# Patient Record
Sex: Female | Born: 1980 | Race: Black or African American | Hispanic: No | Marital: Single | Smoking: Never smoker
Health system: Southern US, Community
[De-identification: ages and names within clinical notes are randomized; demographics above are authoritative.]

## PROBLEM LIST (undated history)

## (undated) DIAGNOSIS — G43909 Migraine, unspecified, not intractable, without status migrainosus: Secondary | ICD-10-CM

## (undated) HISTORY — PX: HAND SURGERY: SHX662

---

## 2005-12-21 ENCOUNTER — Emergency Department: Payer: Self-pay | Admitting: Emergency Medicine

## 2006-02-18 ENCOUNTER — Other Ambulatory Visit: Payer: Self-pay

## 2006-02-18 ENCOUNTER — Emergency Department: Payer: Self-pay | Admitting: Emergency Medicine

## 2007-01-01 ENCOUNTER — Emergency Department: Payer: Self-pay | Admitting: Internal Medicine

## 2008-01-17 ENCOUNTER — Emergency Department: Payer: Self-pay | Admitting: Emergency Medicine

## 2008-09-08 ENCOUNTER — Ambulatory Visit: Payer: Self-pay | Admitting: Internal Medicine

## 2011-04-16 DIAGNOSIS — Z309 Encounter for contraceptive management, unspecified: Secondary | ICD-10-CM | POA: Insufficient documentation

## 2013-04-02 DIAGNOSIS — H9312 Tinnitus, left ear: Secondary | ICD-10-CM | POA: Insufficient documentation

## 2013-04-30 ENCOUNTER — Ambulatory Visit: Payer: Self-pay | Admitting: Unknown Physician Specialty

## 2014-11-14 ENCOUNTER — Emergency Department
Admission: EM | Admit: 2014-11-14 | Discharge: 2014-11-14 | Disposition: A | Discharge status: Home / Self Care | Payer: No Typology Code available for payment source | Attending: Emergency Medicine | Admitting: Emergency Medicine

## 2014-11-14 DIAGNOSIS — Y998 Other external cause status: Secondary | ICD-10-CM | POA: Diagnosis not present

## 2014-11-14 DIAGNOSIS — Y92410 Unspecified street and highway as the place of occurrence of the external cause: Secondary | ICD-10-CM | POA: Diagnosis not present

## 2014-11-14 DIAGNOSIS — Y9389 Activity, other specified: Secondary | ICD-10-CM | POA: Insufficient documentation

## 2014-11-14 DIAGNOSIS — S3992XA Unspecified injury of lower back, initial encounter: Secondary | ICD-10-CM | POA: Insufficient documentation

## 2014-11-14 MED ORDER — IBUPROFEN 800 MG PO TABS: 800.0000 mg | ORAL_TABLET | Freq: Three times a day (TID) | ORAL | Status: DC | PRN

## 2014-11-14 MED ORDER — TRAMADOL HCL 50 MG PO TABS: 50.0000 mg | ORAL_TABLET | Freq: Four times a day (QID) | ORAL | Status: DC | PRN

## 2014-11-14 MED ORDER — IBUPROFEN 800 MG PO TABS
ORAL_TABLET | ORAL | Status: AC
  Administered 2014-11-14: 800 mg via ORAL
  Filled 2014-11-14: qty 1

## 2014-11-14 MED ORDER — IBUPROFEN 800 MG PO TABS
800.0000 mg | ORAL_TABLET | Freq: Once | ORAL | Status: AC
  Administered 2014-11-14: 800 mg via ORAL

## 2014-11-14 MED ORDER — METHOCARBAMOL 500 MG PO TABS
ORAL_TABLET | ORAL | Status: AC
  Administered 2014-11-14: 1000 mg via ORAL
  Filled 2014-11-14: qty 2

## 2014-11-14 MED ORDER — METHOCARBAMOL 500 MG PO TABS
1000.0000 mg | ORAL_TABLET | Freq: Once | ORAL | Status: AC
  Administered 2014-11-14: 1000 mg via ORAL

## 2014-11-14 MED ORDER — METHOCARBAMOL 750 MG PO TABS: 1500.0000 mg | ORAL_TABLET | Freq: Four times a day (QID) | ORAL | Status: DC
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
## 2014-11-14 NOTE — Discharge Instructions (Signed)
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
## 2014-12-11 ENCOUNTER — Ambulatory Visit
Admission: RE | Admit: 2014-12-11 | Discharge: 2014-12-11 | Disposition: A | Discharge status: Home / Self Care | Payer: No Typology Code available for payment source | Source: Ambulatory Visit | Attending: Chiropractor | Admitting: Chiropractor

## 2014-12-11 ENCOUNTER — Other Ambulatory Visit: Payer: Self-pay | Admitting: Chiropractor

## 2014-12-11 DIAGNOSIS — M546 Pain in thoracic spine: Secondary | ICD-10-CM | POA: Diagnosis present

## 2014-12-11 DIAGNOSIS — R262 Difficulty in walking, not elsewhere classified: Secondary | ICD-10-CM | POA: Diagnosis present

## 2014-12-11 DIAGNOSIS — M545 Low back pain: Secondary | ICD-10-CM | POA: Diagnosis present
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
## 2015-12-30 ENCOUNTER — Encounter: Payer: Self-pay | Admitting: Emergency Medicine

## 2015-12-30 ENCOUNTER — Emergency Department
Admission: EM | Admit: 2015-12-30 | Discharge: 2015-12-30 | Disposition: A | Discharge status: Home / Self Care | Payer: BLUE CROSS/BLUE SHIELD | Attending: Emergency Medicine | Admitting: Emergency Medicine

## 2015-12-30 DIAGNOSIS — Z5321 Procedure and treatment not carried out due to patient leaving prior to being seen by health care provider: Secondary | ICD-10-CM | POA: Insufficient documentation

## 2015-12-30 DIAGNOSIS — R109 Unspecified abdominal pain: Secondary | ICD-10-CM | POA: Diagnosis not present

## 2015-12-30 LAB — COMPREHENSIVE METABOLIC PANEL
ALBUMIN: 4.1 g/dL (ref 3.5–5.0)
ALT: 11 U/L — ABNORMAL LOW (ref 14–54)
AST: 16 U/L (ref 15–41)
Alkaline Phosphatase: 41 U/L (ref 38–126)
Anion gap: 9 (ref 5–15)
BUN: 14 mg/dL (ref 6–20)
CO2: 23 mmol/L (ref 22–32)
CREATININE: 0.94 mg/dL (ref 0.44–1.00)
Calcium: 9 mg/dL (ref 8.9–10.3)
Chloride: 105 mmol/L (ref 101–111)
GFR calc Af Amer: >60 mL/min (ref >60)
GFR calc non Af Amer: >60 mL/min (ref >60)
GLUCOSE: 103 mg/dL — AB (ref 65–99)
Potassium: 3.7 mmol/L (ref 3.5–5.1)
SODIUM: 137 mmol/L (ref 135–145)
TOTAL PROTEIN: 7.1 g/dL (ref 6.5–8.1)
Total Bilirubin: 0.7 mg/dL (ref 0.3–1.2)

## 2015-12-30 LAB — URINALYSIS COMPLETE WITH MICROSCOPIC (ARMC ONLY)
BILIRUBIN URINE: NEGATIVE
Bacteria, UA: NONE SEEN
Glucose, UA: NEGATIVE mg/dL
HGB URINE DIPSTICK: NEGATIVE
LEUKOCYTES UA: NEGATIVE
NITRITE: NEGATIVE
PH: 7 (ref 5.0–8.0)
Protein, ur: 30 mg/dL — AB
SPECIFIC GRAVITY, URINE: 1.024 (ref 1.005–1.030)

## 2015-12-30 LAB — CBC
HEMATOCRIT: 37 % (ref 35.0–47.0)
Hemoglobin: 13 g/dL (ref 12.0–16.0)
MCH: 31.3 pg (ref 26.0–34.0)
MCHC: 35 g/dL (ref 32.0–36.0)
MCV: 89.4 fL (ref 80.0–100.0)
Platelets: 257 10*3/uL (ref 150–440)
RBC: 4.14 MIL/uL (ref 3.80–5.20)
RDW: 13.6 % (ref 11.5–14.5)
WBC: 7.4 10*3/uL (ref 3.6–11.0)

## 2015-12-30 LAB — LIPASE, BLOOD: Lipase: 21 U/L (ref 11–51)

## 2015-12-30 LAB — POCT PREGNANCY, URINE: Preg Test, Ur: NEGATIVE

## 2015-12-30 NOTE — ED Triage Notes (Signed)
Patient ambulatory to triage with steady gait, without difficulty or distress noted; pt reports mid abd pain since Friday; N/V tonight

## 2015-12-31 ENCOUNTER — Emergency Department
Admission: EM | Admit: 2015-12-31 | Discharge: 2015-12-31 | Disposition: A | Discharge status: Home / Self Care | Payer: BLUE CROSS/BLUE SHIELD | Attending: Student in an Organized Health Care Education/Training Program | Admitting: Student in an Organized Health Care Education/Training Program

## 2015-12-31 ENCOUNTER — Encounter: Payer: Self-pay | Admitting: Emergency Medicine

## 2015-12-31 DIAGNOSIS — R112 Nausea with vomiting, unspecified: Secondary | ICD-10-CM | POA: Diagnosis not present

## 2015-12-31 DIAGNOSIS — Z79899 Other long term (current) drug therapy: Secondary | ICD-10-CM | POA: Diagnosis not present

## 2015-12-31 DIAGNOSIS — R197 Diarrhea, unspecified: Secondary | ICD-10-CM | POA: Diagnosis present

## 2015-12-31 MED ORDER — PROMETHAZINE HCL 12.5 MG PO TABS: 12.5000 mg | ORAL_TABLET | Freq: Four times a day (QID) | ORAL | 0 refills | Status: AC | PRN

## 2015-12-31 MED ORDER — METOCLOPRAMIDE HCL 5 MG/ML IJ SOLN
10.0000 mg | Freq: Once | INTRAMUSCULAR | Status: AC
  Administered 2015-12-31: 10 mg via INTRAVENOUS
  Filled 2015-12-31: qty 2

## 2015-12-31 MED ORDER — SODIUM CHLORIDE 0.9 % IV BOLUS (SEPSIS)
1000.0000 mL | Freq: Once | INTRAVENOUS | Status: AC
  Administered 2015-12-31: 1000 mL via INTRAVENOUS

## 2015-12-31 MED ORDER — PROMETHAZINE HCL 25 MG/ML IJ SOLN: 25.0000 mg | Freq: Once | INTRAMUSCULAR | Status: DC

## 2015-12-31 NOTE — ED Provider Notes (Signed)
[facility] Emergency Department Provider Note    None    (approximate)  I have reviewed the triage vital signs and the nursing notes.   HISTORY  Chief Complaint Abdominal Pain    HPI Susan Saunders is a 35 y.o. female who presents with 3 days of midepigastric pain associated with nausea vomiting and a few episodes of nonbloody diarrhea. Patient was in the ER yesterday had lab work was drawn which was normal but she left before she is able to be seen. She denies any fever or shortness of breath or cough. Denies any blood in her vomit. She never had this symptom before. States that the nausea is worsened by eating. No History of previous abdominal surgeries.LMP was last week.  Prevacid test performed yesterday was negative.  She denies any pelvic pain, vaginal bleeding, vaginal discharge, dysuria or flank pain.   History reviewed. No pertinent past medical history.  There are no active problems to display for this patient.   History reviewed. No pertinent surgical history.  Prior to Admission medications   Medication Sig Start Date End Date Taking? Authorizing Provider  ibuprofen (ADVIL,MOTRIN) 800 MG tablet Take 1 tablet (800 mg total) by mouth every 8 (eight) hours as needed for moderate pain. 11/14/14   Joni Reining, PA-C  methocarbamol (ROBAXIN-750) 750 MG tablet Take 2 tablets (1,500 mg total) by mouth 4 (four) times daily. 11/14/14   Joni Reining, PA-C  promethazine (PHENERGAN) 12.5 MG tablet Take 1 tablet (12.5 mg total) by mouth every 6 (six) hours as needed for nausea or vomiting. 12/31/15   Willy Eddy, MD  traMADol (ULTRAM) 50 MG tablet Take 1 tablet (50 mg total) by mouth every 6 (six) hours as needed for moderate pain. 11/14/14   Joni Reining, PA-C    Allergies Review of patient's allergies indicates no known allergies.  History reviewed. No pertinent family history.  Social History Social History  Substance Use Topics  . Smoking  status: Never Smoker  . Smokeless tobacco: Never Used  . Alcohol use No    Review of Systems Patient denies headaches, rhinorrhea, blurry vision, numbness, shortness of breath, chest pain, edema, cough, abdominal pain, nausea, vomiting, diarrhea, dysuria, fevers, rashes or hallucinations unless otherwise stated above in HPI. ____________________________________________   PHYSICAL EXAM:  VITAL SIGNS: ED Triage Vitals [12/31/15 0917]  Enc Vitals Group     BP 107/81     Pulse Rate 72     Resp 18     Temp 98.7 F (37.1 C)     Temp Source Oral     SpO2 100 %     Weight 130 lb (59 kg)     Height  (1.651 m)     Head Circumference      Peak Flow      Pain Score      Pain Loc      Pain Edu?      Excl. in GC?     Constitutional: Alert and oriented. Well appearing and in no acute distress. Eyes: Conjunctivae are normal. PERRL. EOMI. Head: Atraumatic. Nose: No congestion/rhinnorhea. Mouth/Throat: Mucous membranes are moist.  Oropharynx non-erythematous. Neck: No stridor. Painless ROM. No cervical spine tenderness to palpation Hematological/Lymphatic/Immunilogical: No cervical lymphadenopathy. Cardiovascular: Normal rate, regular rhythm. Grossly normal heart sounds.  Good peripheral circulation. Respiratory: Normal respiratory effort.  No retractions. Lungs CTAB. Gastrointestinal: Soft and nontender. No distention. No abdominal bruits. No CVA tenderness. Genitourinary:  Musculoskeletal: No lower  extremity tenderness nor edema.  No joint effusions. Neurologic:  Normal speech and language. No gross focal neurologic deficits are appreciated. No gait instability. Skin:  Skin is warm, dry and intact. No rash noted. Psychiatric: Mood and affect are normal. Speech and behavior are normal.  ____________________________________________   LABS (all labs ordered are listed, but only abnormal results are displayed)  Labs Reviewed - No data to  display ____________________________________________  EKG  None ____________________________________________  RADIOLOGY  None ____________________________________________   PROCEDURES  Procedure(s) performed: none    Critical Care performed: no ____________________________________________   INITIAL IMPRESSION / ASSESSMENT AND PLAN / ED COURSE  Pertinent labs & imaging results that were available during my care of the patient were reviewed by me and considered in my medical decision making (see chart for details).  DDX: age, gastroenteritis, pancreatitis, cholelithiasis, cholecystitis, uti,   Clinical Course  Comment By Time  Bedside US performed and shows no gall stones or pericholycystic fluid.   Willy Eddy, MD 08/02 1000    Patient presents with several days of epigastric pain nausea vomiting. Patient with laboratory evaluation performed yesterday without leukocytosis or acidosis. Urine pregnancy test was negative. She is not having evidence of urinary tract infection.  At this time her abdominal exam is soft and benign and do not feel further diagnostic imaging as clinically indicated. As her symptoms are unchanged do not feel that repeat laboratory testing is indicated at this time. Patient provided with IV fluids due to recurrent vomiting and need for hydration as well as PO antiemetics.    11:55AM   Patient with still benign abdominal exam. Bedside ultrasound does not show any evidence of cholelithiasis or cholecystitis. No tolerate by mouth and remains hemodynamically stable. Patient provided with work note and prescription for antiemetics as well as strict return precautions should her discomfort returned and worsened.  Have discussed with the patient and available family all diagnostics and treatments performed thus far and all questions were answered to the best of my ability. The patient demonstrates understanding and agreement with  plan.  ____________________________________________   FINAL CLINICAL IMPRESSION(S) / ED DIAGNOSES  Final diagnoses:  Non-intractable vomiting with nausea, unspecified vomiting type      NEW MEDICATIONS STARTED DURING THIS VISIT:  Discharge Medication List as of 12/31/2015 10:39 AM    START taking these medications   Details  promethazine (PHENERGAN) 12.5 MG tablet Take 1 tablet (12.5 mg total) by mouth every 6 (six) hours as needed for nausea or vomiting., Starting Wed 12/31/2015, Print         Note:  This document was prepared using Dragon voice recognition software and may include unintentional dictation errors.    Willy Eddy, MD 12/31/15 [phone]

## 2015-12-31 NOTE — ED Triage Notes (Signed)
Pt from home; came yesterday but left. Pt states she began to have epigastric pain on Friday and vomiting began on Sunday night. Pt states she vomited at least 7 times in last 24 hours, with some diarrhea yesterday. Pt states pain is intermittent, and that she is unable to keep down food or drink.

## 2015-12-31 NOTE — ED Notes (Signed)
Pt given water and crackers for PO challenge

## 2015-12-31 NOTE — ED Notes (Signed)
Pt discharged home after verbalizing understanding of discharge instructions; nad noted. 

## 2015-12-31 NOTE — ED Notes (Signed)
Pt states she is still having some abdominal pain but that the nausea has subsided. Able to eat and drink without vomiting. NAD noted.

## 2018-02-03 ENCOUNTER — Other Ambulatory Visit: Payer: Self-pay | Admitting: Physician Assistant

## 2018-02-03 ENCOUNTER — Ambulatory Visit
Admission: RE | Admit: 2018-02-03 | Discharge: 2018-02-03 | Disposition: A | Discharge status: Home / Self Care | Payer: No Typology Code available for payment source | Source: Ambulatory Visit | Attending: Physician Assistant | Admitting: Physician Assistant

## 2018-02-03 DIAGNOSIS — X58XXXA Exposure to other specified factors, initial encounter: Secondary | ICD-10-CM | POA: Diagnosis not present

## 2018-02-03 DIAGNOSIS — R52 Pain, unspecified: Secondary | ICD-10-CM

## 2018-02-03 DIAGNOSIS — M79631 Pain in right forearm: Secondary | ICD-10-CM | POA: Insufficient documentation

## 2018-02-03 DIAGNOSIS — M25531 Pain in right wrist: Secondary | ICD-10-CM | POA: Insufficient documentation

## 2019-03-14 DIAGNOSIS — M5441 Lumbago with sciatica, right side: Secondary | ICD-10-CM | POA: Insufficient documentation

## 2020-06-02 ENCOUNTER — Ambulatory Visit
Admission: EM | Admit: 2020-06-02 | Discharge: 2020-06-02 | Disposition: A | Discharge status: Home / Self Care | Payer: BLUE CROSS/BLUE SHIELD | Attending: Physician Assistant | Admitting: Physician Assistant

## 2020-06-02 ENCOUNTER — Other Ambulatory Visit: Payer: Self-pay

## 2020-06-02 DIAGNOSIS — M791 Myalgia, unspecified site: Secondary | ICD-10-CM | POA: Diagnosis not present

## 2020-06-02 DIAGNOSIS — U071 COVID-19: Secondary | ICD-10-CM | POA: Insufficient documentation

## 2020-06-02 DIAGNOSIS — J029 Acute pharyngitis, unspecified: Secondary | ICD-10-CM | POA: Insufficient documentation

## 2020-06-02 DIAGNOSIS — R059 Cough, unspecified: Secondary | ICD-10-CM

## 2020-06-02 HISTORY — DX: Migraine, unspecified, not intractable, without status migrainosus: G43.909

## 2020-06-02 LAB — GROUP A STREP BY PCR: Group A Strep by PCR: NOT DETECTED

## 2020-06-02 MED ORDER — LIDOCAINE VISCOUS HCL 2 % MT SOLN: 5.0000 mL | OROMUCOSAL | 0 refills | Status: AC | PRN

## 2020-06-02 NOTE — ED Provider Notes (Signed)
[facility]    CSN: [id number] Arrival date & time: 06/02/20  1436      History   Chief Complaint Chief Complaint  Patient presents with  . Headache    [phone]    HPI Susan Saunders is a 40 y.o. female presenting for onset of headache, body aches, and sore throat yesterday.  Admits to mild cough yesterday, but not today.  Patient denies any fever, chills, sweats, congestion, chest pain, difficulty breathing, abdominal pain, N/V/D, or changes in smell or taste.  She has not been taking any medication over-the-counter.  Patient denies known COVID-19 exposure.  She has a personal history of COVID-19 in January 2021.   Patient not vaccinated for COVID-19 or influenza.  Patient has history of migraines.  Patient has no other complaints today.  HPI  Past Medical History:  Diagnosis Date  . Migraine     There are no problems to display for this patient.   History reviewed. No pertinent surgical history.  OB History   No obstetric history on file.      Home Medications    Prior to Admission medications   Medication Sig Start Date End Date Taking? Authorizing Provider  amitriptyline (ELAVIL) 50 MG tablet Take 50 mg by mouth at bedtime.   Yes [provider]  Fremanezumab-vfrm (AJOVY Lutsen) Inject into the skin.   Yes [provider]  gabapentin (NEURONTIN) 300 MG capsule Take 300 mg by mouth 3 (three) times daily.   Yes [provider]  ibuprofen (ADVIL,MOTRIN) 800 MG tablet Take 1 tablet (800 mg total) by mouth every 8 (eight) hours as needed for moderate pain. 11/14/14  Yes Joni Reining, PA-C  lidocaine (XYLOCAINE) 2 % solution Use as directed 5 mLs in the mouth or throat every 3 (three) hours as needed for up to 3 days for mouth pain (swish and spit). 06/02/20 06/05/20 Yes Shirlee Latch, PA-C  promethazine (PHENERGAN) 12.5 MG tablet Take 1 tablet (12.5 mg total) by mouth every 6 (six) hours as needed for nausea or vomiting. 12/31/15   Yes Willy Eddy, MD  Rimegepant Sulfate (NURTEC PO) Take by mouth.   Yes [provider]  rizatriptan (MAXALT) 10 MG tablet Take 10 mg by mouth as needed for migraine. May repeat in 2 hours if needed   Yes [provider]  methocarbamol (ROBAXIN-750) 750 MG tablet Take 2 tablets (1,500 mg total) by mouth 4 (four) times daily. 11/14/14   Joni Reining, PA-C  traMADol (ULTRAM) 50 MG tablet Take 1 tablet (50 mg total) by mouth every 6 (six) hours as needed for moderate pain. 11/14/14   Joni Reining, PA-C    Family History Family History  Problem Relation Age of Onset  . Thyroid disease Mother   . Hyperlipidemia Father   . Diabetes Father   . Hypertension Father     Social History Social History   Tobacco Use  . Smoking status: Never Smoker  . Smokeless tobacco: Never Used  Vaping Use  . Vaping Use: Never used  Substance Use Topics  . Alcohol use: No  . Drug use: No     Allergies   Patient has no known allergies.   Review of Systems Review of Systems  Constitutional: Positive for fatigue. Negative for chills, diaphoresis and fever.  HENT: Positive for sore throat. Negative for congestion, ear pain, rhinorrhea, sinus pressure and sinus pain.   Respiratory: Positive for cough. Negative for shortness of breath.  Gastrointestinal: Negative for abdominal pain, nausea and vomiting.  Musculoskeletal: Positive for myalgias. Negative for arthralgias.  Skin: Negative for rash.  Neurological: Positive for headaches. Negative for weakness.  Hematological: Negative for adenopathy.     Physical Exam Triage Vital Signs ED Triage Vitals  Enc Vitals Group     BP 06/02/20 1703 123/75     Pulse Rate 06/02/20 1703 68     Resp 06/02/20 1703 18     Temp 06/02/20 1703 99.1 F (37.3 C)     Temp Source 06/02/20 1703 Oral     SpO2 06/02/20 1703 100 %     Weight --      Height --      Head Circumference --      Peak Flow --      Pain Score 06/02/20 1542 6      Pain Loc --      Pain Edu? --      Excl. in GC? --    No data found.  Updated Vital Signs BP 123/75 (BP Location: Left Arm)   Pulse 68   Temp 99.1 F (37.3 C) (Oral)   Resp 18   LMP 05/14/2020   SpO2 100%       Physical Exam Vitals and nursing note reviewed.  Constitutional:      General: She is not in acute distress.    Appearance: Normal appearance. She is not ill-appearing or toxic-appearing.  HENT:     Head: Normocephalic and atraumatic.     Nose: Nose normal.     Mouth/Throat:     Mouth: Mucous membranes are moist.     Pharynx: Oropharynx is clear. Posterior oropharyngeal erythema (moderate) present.  Eyes:     General: No scleral icterus.       Right eye: No discharge.        Left eye: No discharge.     Conjunctiva/sclera: Conjunctivae normal.  Cardiovascular:     Rate and Rhythm: Normal rate and regular rhythm.     Heart sounds: Normal heart sounds.  Pulmonary:     Effort: Pulmonary effort is normal. No respiratory distress.     Breath sounds: Normal breath sounds.  Musculoskeletal:     Cervical back: Neck supple.  Skin:    General: Skin is dry.  Neurological:     General: No focal deficit present.     Mental Status: She is alert. Mental status is at baseline.     Motor: No weakness.     Gait: Gait normal.  Psychiatric:        Mood and Affect: Mood normal.        Behavior: Behavior normal.        Thought Content: Thought content normal.      UC Treatments / Results  Labs (all labs ordered are listed, but only abnormal results are displayed) Labs Reviewed  SARS CORONAVIRUS 2 (TAT 6-24 HRS)  GROUP A STREP BY PCR    EKG   Radiology No results found.  Procedures Procedures (including critical care time)  Medications Ordered in UC Medications - No data to display  Initial Impression / Assessment and Plan / UC Course  I have reviewed the triage vital signs and the nursing notes.  Pertinent labs & imaging results that were available  during my care of the patient were reviewed by me and considered in my medical decision making (see chart for details).    Molecular strep test obtained.  Advised patient we will call  with results are positive.  Covid testing sent out.  Current CDC guidelines, isolation protocol, and ED precautions reviewed with patient.  Advised her she likely has a viral illness and should feel better after supportive care.  Advised increasing rest and fluids.  Sent Viscous Lidocaine for sore throat.  Advised to follow-up with [place] as needed for any new or worsening symptoms or if she is not getting better after 10 days.  Negative strep.   Final Clinical Impressions(s) / UC Diagnoses   Final diagnoses:  Acute pharyngitis, unspecified etiology  Myalgia  Cough     Discharge Instructions     You have received COVID testing today either for positive exposure, concerning symptoms that could be related to COVID infection, screening purposes, or re-testing after confirmed positive.  Your test obtained today checks for active viral infection in the last 1-2 weeks. If your test is negative now, you can still test positive later. So, if you do develop symptoms you should either get re-tested and/or isolate x 5 days and then strict mask use x 5 days (unvaccinated) or mask use x 10 days (vaccinated). Please follow CDC guidelines.  While Rapid antigen tests come back in 15-20 minutes, send out PCR/molecular test results typically come back within 1-3 days. In the mean time, if you are symptomatic, assume this could be a positive test and treat/monitor yourself as if you do have COVID.   We will call with test results if positive. Please download the MyChart app and set up a profile to access test results.   If symptomatic, go home and rest. Push fluids. Take Tylenol as needed for discomfort. Gargle warm salt water. Throat lozenges. Take Mucinex DM or Robitussin for cough. Humidifier in bedroom to ease coughing. Warm  showers. Also review the COVID handout for more information.  COVID-19 INFECTION: The incubation period of COVID-19 is approximately 14 days after exposure, with most symptoms developing in roughly 4-5 days. Symptoms may range in severity from mild to critically severe. Roughly 80% of those infected will have mild symptoms. People of any age may become infected with COVID-19 and have the ability to transmit the virus. The most common symptoms include: fever, fatigue, cough, body aches, headaches, sore throat, nasal congestion, shortness of breath, nausea, vomiting, diarrhea, changes in smell and/or taste.    COURSE OF ILLNESS Some patients may begin with mild disease which can progress quickly into critical symptoms. If your symptoms are worsening please call ahead to the Emergency Department and proceed there for further treatment. Recovery time appears to be roughly 1-2 weeks for mild symptoms and 3-6 weeks for severe disease.   GO IMMEDIATELY TO ER FOR FEVER YOU ARE UNABLE TO GET DOWN WITH TYLENOL, BREATHING PROBLEMS, CHEST PAIN, FATIGUE, LETHARGY, INABILITY TO EAT OR DRINK, ETC  QUARANTINE AND ISOLATION: To help decrease the spread of COVID-19 please remain isolated if you have COVID infection or are highly suspected to have COVID infection. This means -stay home and isolate to one room in the home if you live with others. Do not share a bed or bathroom with others while ill, sanitize and wipe down all countertops and keep common areas clean and disinfected. Stay home for 5 days. If you have no symptoms or your symptoms are resolving after 5 days, you can leave your house. Continue to wear a mask around others for 5 additional days. If you have been in close contact (within 6 feet) of someone diagnosed with COVID 19, you are advised  to quarantine in your home for 14 days as symptoms can develop anywhere from 2-14 days after exposure to the virus. If you develop symptoms, you  must isolate.  Most  current guidelines for COVID after exposure -unvaccinated: isolate 5 days and strict mask use x 5 days. Test on day 5 is possible -vaccinated: wear mask x 10 days if symptoms do not develop -You do not necessarily need to be tested for COVID if you have + exposure and  develop symptoms. Just isolate at home x10 days from symptom onset During this global pandemic, CDC advises to practice social distancing, try to stay at least 82ft away from others at all times. Wear a face covering. Wash and sanitize your hands regularly and avoid going anywhere that is not necessary.  KEEP IN MIND THAT THE COVID TEST IS NOT 100% ACCURATE AND YOU SHOULD STILL DO EVERYTHING TO PREVENT POTENTIAL SPREAD OF VIRUS TO OTHERS (WEAR MASK, WEAR GLOVES, [place] HANDS AND SANITIZE REGULARLY). IF INITIAL TEST IS NEGATIVE, THIS MAY NOT MEAN YOU ARE DEFINITELY NEGATIVE. MOST ACCURATE TESTING IS DONE 5-7 DAYS AFTER EXPOSURE.   It is not advised by CDC to get re-tested after receiving a positive COVID test since you can still test positive for weeks to months after you have already cleared the virus.   *If you have not been vaccinated for COVID, I strongly suggest you consider getting vaccinated as long as there are no contraindications.      ED Prescriptions    Medication Sig Dispense Auth. Provider   lidocaine (XYLOCAINE) 2 % solution Use as directed 5 mLs in the mouth or throat every 3 (three) hours as needed for up to 3 days for mouth pain (swish and spit). 100 mL Danton Clap, PA-C     PDMP not reviewed this encounter.   Danton Clap, PA-C 06/02/20 1755

## 2020-06-02 NOTE — ED Notes (Signed)
Pt c/o frontal HA onset yesterday, awoke this morning with sore throat and body aches.  Denies fever, n/v/d, ear pain, cough, SOB

## 2020-06-02 NOTE — ED Triage Notes (Signed)
Pt reports HA, sore throat, and body aches through chest and back x 1 day.  No fever.

## 2020-06-02 NOTE — Discharge Instructions (Addendum)

## 2020-06-03 LAB — SARS CORONAVIRUS 2 (TAT 6-24 HRS): SARS Coronavirus 2: POSITIVE — AB

## 2020-06-20 ENCOUNTER — Other Ambulatory Visit: Payer: Self-pay

## 2020-06-20 ENCOUNTER — Ambulatory Visit
Admission: EM | Admit: 2020-06-20 | Discharge: 2020-06-20 | Disposition: A | Discharge status: Home / Self Care | Payer: BLUE CROSS/BLUE SHIELD | Attending: Family Medicine | Admitting: Family Medicine

## 2020-06-20 ENCOUNTER — Encounter: Payer: Self-pay | Admitting: Family Medicine

## 2020-06-20 DIAGNOSIS — J0110 Acute frontal sinusitis, unspecified: Secondary | ICD-10-CM

## 2020-06-20 DIAGNOSIS — Z1152 Encounter for screening for COVID-19: Secondary | ICD-10-CM

## 2020-06-20 MED ORDER — DEXAMETHASONE SODIUM PHOSPHATE 10 MG/ML IJ SOLN
10.0000 mg | Freq: Once | INTRAMUSCULAR | Status: AC
  Administered 2020-06-20: 10 mg via INTRAMUSCULAR

## 2020-06-20 MED ORDER — AMOXICILLIN-POT CLAVULANATE 875-125 MG PO TABS: 1.0000 | ORAL_TABLET | Freq: Two times a day (BID) | ORAL | 0 refills | Status: DC

## 2020-06-20 NOTE — ED Notes (Signed)
Triage completed by provider.

## 2020-06-20 NOTE — Discharge Instructions (Addendum)
Treating you for a sinus infection  Antibiotics as prescribed Steroid injection given here for sinus pressure, headache and inflammation. You can take tylenol and ibuprofen as needed.

## 2020-06-22 NOTE — ED Provider Notes (Signed)
Renaldo Fiddler    CSN: [id number] Arrival date & time: 06/20/20  1210      History   Chief Complaint Chief Complaint  Patient presents with  . Headache    HPI Susan Saunders is a 40 y.o. female.   Patient is a 40 year old female today with sinus congestion, sinus pressure, headache for approximately 2 weeks.  Symptoms been constant.  Thick mucus from nares.  No fever, chills.  Some postnasal drip and sore throat.  No cough.  Treating with over-the-counter medicines without any relief.  Also has a history of migraines.  Reporting this feels different than her typical migraines.     Past Medical History:  Diagnosis Date  . Migraine     There are no problems to display for this patient.   History reviewed. No pertinent surgical history.  OB History   No obstetric history on file.      Home Medications    Prior to Admission medications   Medication Sig Start Date End Date Taking? Authorizing Provider  amoxicillin-clavulanate (AUGMENTIN) 875-125 MG tablet Take 1 tablet by mouth every 12 (twelve) hours. 06/20/20  Yes Juaquin Ludington A, NP  amitriptyline (ELAVIL) 50 MG tablet Take 50 mg by mouth at bedtime.    [provider]  Fremanezumab-vfrm (AJOVY [place]) Inject into the skin.    [provider]  gabapentin (NEURONTIN) 300 MG capsule Take 300 mg by mouth 3 (three) times daily.    [provider]  ibuprofen (ADVIL,MOTRIN) 800 MG tablet Take 1 tablet (800 mg total) by mouth every 8 (eight) hours as needed for moderate pain. 11/14/14   Joni Reining, PA-C  methocarbamol (ROBAXIN-750) 750 MG tablet Take 2 tablets (1,500 mg total) by mouth 4 (four) times daily. 11/14/14   Joni Reining, PA-C  promethazine (PHENERGAN) 12.5 MG tablet Take 1 tablet (12.5 mg total) by mouth every 6 (six) hours as needed for nausea or vomiting. 12/31/15   Willy Eddy, MD  Rimegepant Sulfate (NURTEC PO) Take by mouth.    [provider]  rizatriptan  (MAXALT) 10 MG tablet Take 10 mg by mouth as needed for migraine. May repeat in 2 hours if needed    [provider]  traMADol (ULTRAM) 50 MG tablet Take 1 tablet (50 mg total) by mouth every 6 (six) hours as needed for moderate pain. 11/14/14   Joni Reining, PA-C    Family History Family History  Problem Relation Age of Onset  . Thyroid disease Mother   . Hyperlipidemia Father   . Diabetes Father   . Hypertension Father     Social History Social History   Tobacco Use  . Smoking status: Never Smoker  . Smokeless tobacco: Never Used  Vaping Use  . Vaping Use: Never used  Substance Use Topics  . Alcohol use: No  . Drug use: No     Allergies   Patient has no known allergies.   Review of Systems Review of Systems   Physical Exam Triage Vital Signs ED Triage Vitals  Enc Vitals Group     BP 06/20/20 1406 123/85     Pulse Rate 06/20/20 1406 65     Resp 06/20/20 1406 (!) 97     Temp 06/20/20 1406 98.3 F (36.8 C)     Temp Source 06/20/20 1406 Oral     SpO2 06/20/20 1406 97 %     Weight --      Height --  Head Circumference --      Peak Flow --      Pain Score 06/20/20 1449 0     Pain Loc --      Pain Edu? --      Excl. in GC? --    No data found.  Updated Vital Signs BP 123/85 (BP Location: Left Arm)   Pulse 65   Temp 98.3 F (36.8 C) (Oral)   Resp (!) 97   SpO2 97%   Visual Acuity Right Eye Distance:   Left Eye Distance:   Bilateral Distance:    Right Eye Near:   Left Eye Near:    Bilateral Near:     Physical Exam Vitals and nursing note reviewed.  Constitutional:      General: She is not in acute distress.    Appearance: Normal appearance. She is not ill-appearing, toxic-appearing or diaphoretic.  HENT:     Head: Normocephalic.     Right Ear: Tympanic membrane and ear canal normal.     Left Ear: Tympanic membrane and ear canal normal.     Nose: Mucosal edema and congestion present.     Right Turbinates: Swollen.     Left  Turbinates: Swollen.     Right Sinus: Maxillary sinus tenderness and frontal sinus tenderness present.     Left Sinus: Maxillary sinus tenderness and frontal sinus tenderness present.     Mouth/Throat:     Pharynx: Oropharynx is clear.  Eyes:     Conjunctiva/sclera: Conjunctivae normal.  Pulmonary:     Effort: Pulmonary effort is normal.     Breath sounds: Normal breath sounds.  Musculoskeletal:        General: Normal range of motion.     Cervical back: Normal range of motion.  Skin:    General: Skin is warm and dry.     Findings: No rash.  Neurological:     Mental Status: She is alert.  Psychiatric:        Mood and Affect: Mood normal.      UC Treatments / Results  Labs (all labs ordered are listed, but only abnormal results are displayed) Labs Reviewed - No data to display  EKG   Radiology No results found.  Procedures Procedures (including critical care time)  Medications Ordered in UC Medications  dexamethasone (DECADRON) injection 10 mg (10 mg Intramuscular Given 06/20/20 1440)    Initial Impression / Assessment and Plan / UC Course  I have reviewed the triage vital signs and the nursing notes.  Pertinent labs & imaging results that were available during my care of the patient were reviewed by me and considered in my medical decision making (see chart for details).     Acute sinusitis with headache Symptoms been present for the past 2 weeks Will cover with amoxicillin at this time. Decadron given here in clinic today. Recommend ibuprofen and Tylenol as needed.  Flonase nasal spray. Follow up as needed for continued or worsening symptoms Final Clinical Impressions(s) / UC Diagnoses   Final diagnoses:  Acute non-recurrent frontal sinusitis     Discharge Instructions     Treating you for a sinus infection  Antibiotics as prescribed Steroid injection given here for sinus pressure, headache and inflammation. You can take tylenol and ibuprofen as  needed.       ED Prescriptions    Medication Sig Dispense Auth. Provider   amoxicillin-clavulanate (AUGMENTIN) 875-125 MG tablet Take 1 tablet by mouth every 12 (twelve) hours. 14 tablet [place], [place]  A, NP     PDMP not reviewed this encounter.   Janace Aris, NP 06/22/20 1557

## 2020-07-20 ENCOUNTER — Other Ambulatory Visit: Payer: Self-pay

## 2020-07-20 ENCOUNTER — Encounter: Payer: Self-pay | Admitting: Emergency Medicine

## 2020-07-20 ENCOUNTER — Ambulatory Visit
Admission: EM | Admit: 2020-07-20 | Discharge: 2020-07-20 | Disposition: A | Discharge status: Home / Self Care | Payer: BLUE CROSS/BLUE SHIELD | Attending: Family Medicine | Admitting: Family Medicine

## 2020-07-20 DIAGNOSIS — G43709 Chronic migraine without aura, not intractable, without status migrainosus: Secondary | ICD-10-CM | POA: Diagnosis not present

## 2020-07-20 MED ORDER — KETOROLAC TROMETHAMINE 60 MG/2ML IM SOLN
60.0000 mg | Freq: Once | INTRAMUSCULAR | Status: AC
  Administered 2020-07-20: 60 mg via INTRAMUSCULAR

## 2020-07-20 NOTE — ED Provider Notes (Signed)
[facility]    CSN: [id number] Arrival date & time: 07/20/20  1414      History   Chief Complaint Chief Complaint  Patient presents with  . Migraine   HPI  40 year old female with known migraines presents with acute migraine.  Recently seen by neurology.  Advised to restart gabapentin, amitriptyline.  Neurologist also advised to use Zomig as needed for acute treatment.  Patient has not picked up her medication.  She reports that she has had a migraine since Wednesday.  Frontal and bitemporal.  Associated photophobia.  Denies nausea vomiting.  Pain currently 5/10 in severity.  She has taken Nurtec once yesterday without resolution.  No other complaints.  Past Medical History:  Diagnosis Date  . Migraine    Past Surgical History:  Procedure Laterality Date  . HAND SURGERY     cyst removal    OB History   No obstetric history on file.      Home Medications    Prior to Admission medications   Medication Sig Start Date End Date Taking? Authorizing Provider  butalbital-acetaminophen-caffeine (FIORICET) 50-325-40 MG tablet Take 1 tablet by mouth every 8 (eight) hours as needed. Acute headache 07/17/20  Yes [provider]  Fremanezumab-vfrm (AJOVY [place]) Inject into the skin.   Yes [provider]  promethazine (PHENERGAN) 12.5 MG tablet Take 1 tablet (12.5 mg total) by mouth every 6 (six) hours as needed for nausea or vomiting. 12/31/15  Yes Willy Eddy, MD  Rimegepant Sulfate (NURTEC PO) Take by mouth.   Yes [provider]  amitriptyline (ELAVIL) 50 MG tablet Take 50 mg by mouth at bedtime.    [provider]  gabapentin (NEURONTIN) 300 MG capsule Take 300 mg by mouth 3 (three) times daily.    [provider]  zolmitriptan (ZOMIG) 5 MG tablet 1/2 to 1 tab at onset of headache. If headache returns, may take a second dose after 2 hours. Max 2 tabs q 24 hrs. 07/17/20   [provider]  rizatriptan (MAXALT) 10 MG  tablet Take 10 mg by mouth as needed for migraine. May repeat in 2 hours if needed  07/20/20  [provider]    Family History Family History  Problem Relation Age of Onset  . Thyroid disease Mother   . Hyperlipidemia Father   . Diabetes Father   . Hypertension Father     Social History Social History   Tobacco Use  . Smoking status: Never Smoker  . Smokeless tobacco: Never Used  Vaping Use  . Vaping Use: Never used  Substance Use Topics  . Alcohol use: No  . Drug use: No     Allergies   Apple   Review of Systems Review of Systems  Eyes: Positive for photophobia.  Neurological: Positive for headaches.    Physical Exam Triage Vital Signs ED Triage Vitals  Enc Vitals Group     BP 07/20/20 1443 100/67     Pulse Rate 07/20/20 1443 84     Resp 07/20/20 1443 18     Temp 07/20/20 1443 98.3 F (36.8 C)     Temp Source 07/20/20 1443 Oral     SpO2 07/20/20 1443 100 %     Weight 07/20/20 1445 146 lb (66.2 kg)     Height 07/20/20 1445 5\' 5"  (1.651 m)     Head Circumference --      Peak Flow --      Pain Score 07/20/20 1442 5  Pain Loc --      Pain Edu? --      Excl. in GC? --    Updated Vital Signs BP 100/67 (BP Location: Left Arm)   Pulse 84   Temp 98.3 F (36.8 C) (Oral)   Resp 18   Ht 5\' 5"  (1.651 m)   Wt 66.2 kg   LMP 07/13/2020 (Approximate)   SpO2 100%   BMI 24.30 kg/m   Visual Acuity Right Eye Distance:   Left Eye Distance:   Bilateral Distance:    Right Eye Near:   Left Eye Near:    Bilateral Near:     Physical Exam Vitals and nursing note reviewed.  Constitutional:      General: She is not in acute distress.    Appearance: Normal appearance. She is not ill-appearing.  HENT:     Head: Normocephalic and atraumatic.  Eyes:     Conjunctiva/sclera: Conjunctivae normal.     Pupils: Pupils are equal, round, and reactive to light.  Cardiovascular:     Rate and Rhythm: Normal rate and regular rhythm.     Heart sounds: No murmur  heard.   Pulmonary:     Effort: Pulmonary effort is normal.     Breath sounds: No wheezing, rhonchi or rales.  Neurological:     Mental Status: She is alert.    UC Treatments / Results  Labs (all labs ordered are listed, but only abnormal results are displayed) Labs Reviewed - No data to display  EKG   Radiology No results found.  Procedures Procedures (including critical care time)  Medications Ordered in UC Medications  ketorolac (TORADOL) injection 60 mg (60 mg Intramuscular Given 07/20/20 1502)    Initial Impression / Assessment and Plan / UC Course  I have reviewed the triage vital signs and the nursing notes.  Pertinent labs & imaging results that were available during my care of the patient were reviewed by me and considered in my medical decision making (see chart for details).    40 year old female presents with migraine.  Treating with Toradol.  Advised to restart her home medications as directed by neurology.  Follow-up with neurology.  Supportive care.  Final Clinical Impressions(s) / UC Diagnoses   Final diagnoses:  Chronic migraine without aura without status migrainosus, not intractable     Discharge Instructions     Restart your medications per neurology.  Take the Zomig if needed.  Go home and try and rest.  Follow up with neurology.  Take care  Dr. 41    ED Prescriptions    None     PDMP not reviewed this encounter.   Adriana Simas, Tommie Sams 07/20/20 1554

## 2020-07-20 NOTE — Discharge Instructions (Signed)
Restart your medications per neurology.  Take the Zomig if needed.  Go home and try and rest.  Follow up with neurology.  Take care  Dr. Adriana Simas

## 2020-07-20 NOTE — ED Triage Notes (Addendum)
Patient in today c/o migraine x 6 days. Patient saw her neurologist 07/17/20. Patient states she wasn't having the migraine when she saw her neurologist on 07/17/20. She was instructed to restart her preventative medications (Gabapentin and Amitriptyline).  Patient states she has not restarted the medications.

## 2020-08-25 ENCOUNTER — Other Ambulatory Visit: Payer: Self-pay

## 2020-08-25 ENCOUNTER — Emergency Department
Admission: EM | Admit: 2020-08-25 | Discharge: 2020-08-25 | Disposition: A | Discharge status: Home / Self Care | Payer: BC Managed Care – PPO | Attending: Emergency Medicine | Admitting: Emergency Medicine

## 2020-08-25 DIAGNOSIS — B349 Viral infection, unspecified: Secondary | ICD-10-CM | POA: Insufficient documentation

## 2020-08-25 DIAGNOSIS — Z20822 Contact with and (suspected) exposure to covid-19: Secondary | ICD-10-CM | POA: Diagnosis not present

## 2020-08-25 DIAGNOSIS — M791 Myalgia, unspecified site: Secondary | ICD-10-CM | POA: Diagnosis present

## 2020-08-25 LAB — RESP PANEL BY RT-PCR (FLU A&B, COVID) ARPGX2
Influenza A by PCR: NEGATIVE
Influenza B by PCR: NEGATIVE
SARS Coronavirus 2 by RT PCR: NEGATIVE

## 2020-08-25 LAB — GROUP A STREP BY PCR: Group A Strep by PCR: NOT DETECTED

## 2020-08-25 NOTE — ED Notes (Signed)
See triage note   Presents with sore throat and body aches since yesterday  Afebrile   Also states she was COVID positive in Jan.2022  Has had nasal irritation since

## 2020-08-25 NOTE — Discharge Instructions (Signed)
Continue with antibiotics as prescribed.  Consider taking over-the-counter allergy medicines and decongestants (pseudoephedrine), for ongoing symptom relief.  Take Tylenol and Motrin as needed for body aches.  Follow-up with your primary provider for ongoing symptoms.  Return to the ED if needed.

## 2020-08-25 NOTE — ED Provider Notes (Signed)
[facility] Emergency Department Provider Note ____________________________________________  Time seen: 1008  I have reviewed the triage vital signs and the nursing notes.  HISTORY  Chief Complaint  Generalized Body Aches  HPI Susan Saunders is a 40 y.o. female presents to the ED for evaluation of generalized body aches and irritation to her nose per patient report symptoms been present for last few days.  She describes inflammation and pain primarily to the right nostril.  For several weeks.  She was apparently evaluated by her primary provider for the same complaint, and started on the 10-day course of doxycycline and topical mupirocin to be applied to the nostril.  She denies any interim fever, chest pain, cough, or congestion.  She does not endorse any nausea, vomiting, or diarrhea.  She denies any sick contacts, recent travel, or bad food exposure.  She has not been vaccinated against Covid or influenza.   Past Medical History:  Diagnosis Date  . Migraine     There are no problems to display for this patient.   Past Surgical History:  Procedure Laterality Date  . HAND SURGERY     cyst removal    Prior to Admission medications   Medication Sig Start Date End Date Taking? Authorizing Provider  amitriptyline (ELAVIL) 50 MG tablet Take 50 mg by mouth at bedtime.    [provider]  butalbital-acetaminophen-caffeine (FIORICET) 50-325-40 MG tablet Take 1 tablet by mouth every 8 (eight) hours as needed. Acute headache 07/17/20   [provider]  Fremanezumab-vfrm (AJOVY [place]) Inject into the skin.    [provider]  gabapentin (NEURONTIN) 300 MG capsule Take 300 mg by mouth 3 (three) times daily.    [provider]  promethazine (PHENERGAN) 12.5 MG tablet Take 1 tablet (12.5 mg total) by mouth every 6 (six) hours as needed for nausea or vomiting. 12/31/15   Willy Eddy, MD  Rimegepant Sulfate (NURTEC PO) Take by mouth.     [provider]  zolmitriptan (ZOMIG) 5 MG tablet 1/2 to 1 tab at onset of headache. If headache returns, may take a second dose after 2 hours. Max 2 tabs q 24 hrs. 07/17/20   [provider]  rizatriptan (MAXALT) 10 MG tablet Take 10 mg by mouth as needed for migraine. May repeat in 2 hours if needed  07/20/20  [provider]    Allergies Apple  Family History  Problem Relation Age of Onset  . Thyroid disease Mother   . Hyperlipidemia Father   . Diabetes Father   . Hypertension Father     Social History Social History   Tobacco Use  . Smoking status: Never Smoker  . Smokeless tobacco: Never Used  Vaping Use  . Vaping Use: Never used  Substance Use Topics  . Alcohol use: No  . Drug use: No    Review of Systems  Constitutional: Negative for fever. Eyes: Negative for visual changes. ENT: Positive for sore throat.  Reports nasal congestion. Cardiovascular: Negative for chest pain. Respiratory: Negative for shortness of breath. Gastrointestinal: Negative for abdominal pain, vomiting and diarrhea. Genitourinary: Negative for dysuria. Musculoskeletal: Negative for back pain.  Reports generalized body aches. Skin: Negative for rash. Neurological: Negative for headaches, focal weakness or numbness. ____________________________________________  PHYSICAL EXAM:  VITAL SIGNS: ED Triage Vitals  Enc Vitals Group     BP 08/25/20 0940 121/76     Pulse Rate 08/25/20 0940 (!) 101     Resp 08/25/20 0940 18  Temp 08/25/20 0940 98.5 F (36.9 C)     Temp Source 08/25/20 0940 Oral     SpO2 08/25/20 0940 100 %     Weight 08/25/20 0943 145 lb (65.8 kg)     Height 08/25/20 0943 5\' 5"  (1.651 m)     Head Circumference --      Peak Flow --      Pain Score 08/25/20 0943 5     Pain Loc --      Pain Edu? --      Excl. in GC? --     Constitutional: Alert and oriented. Well appearing and in no distress. Head: Normocephalic and atraumatic. Eyes:  Conjunctivae are normal. Normal extraocular movements Ears: Canals clear. TMs intact bilaterally. Nose: No congestion/rhinorrhea/epistaxis.  Nasal turbinates are pink, moist, mildly edematous. Mouth/Throat: Mucous membranes are moist.  Uvula is midline and tonsils are flat.  No pharyngeal lesions. Neck: Supple. No thyromegaly. Hematological/Lymphatic/Immunological: No cervical lymphadenopathy. Cardiovascular: Normal rate, regular rhythm. Normal distal pulses. Respiratory: Normal respiratory effort.  Musculoskeletal: Nontender with normal range of motion in all extremities.  Neurologic:  Normal gait without ataxia. Normal speech and language. No gross focal neurologic deficits are appreciated. Skin:  Skin is warm, dry and intact. No rash noted. ____________________________________________   LABS (pertinent positives/negatives) Labs Reviewed  RESP PANEL BY RT-PCR (FLU A&B, COVID) ARPGX2  GROUP A STREP BY PCR  ____________________________________________  PROCEDURES  Procedures ____________________________________________  INITIAL IMPRESSION / ASSESSMENT AND PLAN / ED COURSE  DDX: strep throat, viral URI, Covid, Influenza, rhinitis  Patient ED evaluation of body aches, malaise, and nasal congestion.  She is currently on a 10-day course of doxycycline and mupirocin ointment from her primary provider.  She denies any interim fevers.  She will be screened today for influenza and Covid as well as strep.  She will continue doxy as provided and will be followed up with results as they are pending at the time of this disposition.  Patient is afebrile and clinically stable at this time.   Susan Saunders was evaluated in Emergency Department on 08/25/2020 for the symptoms described in the history of present illness. She was evaluated in the context of the global COVID-19 pandemic, which necessitated consideration that the patient might be at risk for infection with the SARS-CoV-2 virus that causes  COVID-19. Institutional protocols and algorithms that pertain to the evaluation of patients at risk for COVID-19 are in a state of rapid change based on information released by regulatory bodies including the CDC and federal and state organizations. These policies and algorithms were followed during the patient's care in the ED. ____________________________________________  FINAL CLINICAL IMPRESSION(S) / ED DIAGNOSES  Final diagnoses:  Viral syndrome      08/27/2020, Karmen Stabs, PA-C 08/25/20 1034    08/27/20, MD 08/25/20 1529

## 2020-08-25 NOTE — ED Triage Notes (Signed)
Pt c/o generalized body aches with irritated nose, for the past couple of days, had issues with inflammation/pain of the nose for several weeks , no wound. Was seen by PCP for that

## 2021-12-16 ENCOUNTER — Ambulatory Visit
Admission: RE | Admit: 2021-12-16 | Discharge: 2021-12-16 | Disposition: A | Discharge status: Home / Self Care | Payer: BC Managed Care – PPO | Source: Ambulatory Visit | Attending: Family Medicine | Admitting: Family Medicine

## 2021-12-16 VITALS — BP 115/80 | HR 81 | Temp 97.9°F | Resp 18

## 2021-12-16 DIAGNOSIS — R197 Diarrhea, unspecified: Secondary | ICD-10-CM

## 2021-12-16 NOTE — ED Provider Notes (Signed)
Renaldo Fiddler    CSN: [id number] Arrival date & time: 12/16/21  1108      History   Chief Complaint Chief Complaint  Patient presents with   Diarrhea    Entered by patient    HPI CANDENCE SEASE is a 41 y.o. female.  Patient presents with 2-day history of diarrhea.  1 bowel movement today.  No fever, chills, abdominal pain, nausea, vomiting, vaginal discharge, pelvic pain, or other symptoms.  No treatments at home.  No recent travel out of the country or recent antibiotics.    The history is provided by the patient and medical records.    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      Home Medications    Prior to Admission medications   Medication Sig Start Date End Date Taking? Authorizing Provider  amitriptyline (ELAVIL) 50 MG tablet Take 50 mg by mouth at bedtime.    [provider]  butalbital-acetaminophen-caffeine (FIORICET) 50-325-40 MG tablet Take 1 tablet by mouth every 8 (eight) hours as needed. Acute headache 07/17/20   [provider]  Fremanezumab-vfrm (AJOVY [place]) Inject into the skin.    [provider]  gabapentin (NEURONTIN) 300 MG capsule Take 300 mg by mouth 3 (three) times daily.    [provider]  promethazine (PHENERGAN) 12.5 MG tablet Take 1 tablet (12.5 mg total) by mouth every 6 (six) hours as needed for nausea or vomiting. 12/31/15   Willy Eddy, MD  Rimegepant Sulfate (NURTEC PO) Take by mouth.    [provider]  zolmitriptan (ZOMIG) 5 MG tablet 1/2 to 1 tab at onset of headache. If headache returns, may take a second dose after 2 hours.  Max 2 tabs q 24 hrs. 07/17/20   [provider]  rizatriptan (MAXALT) 10 MG tablet Take 10 mg by mouth as needed for migraine. May repeat in 2 hours if needed  07/20/20  [provider]    Family History Family History  Problem Relation Age of Onset   Thyroid disease Mother    Hyperlipidemia Father    Diabetes Father    Hypertension Father     Social History Social History   Tobacco Use   Smoking status: Never   Smokeless tobacco: Never  Vaping Use   Vaping Use: Never used  Substance Use Topics   Alcohol use: No   Drug use: No     Allergies   Apple juice   Review of Systems Review of Systems  Constitutional:  Negative for chills and fever.  Respiratory:  Negative for cough and shortness of breath.   Cardiovascular:  Negative for chest pain and palpitations.  Gastrointestinal:  Positive for diarrhea. Negative for abdominal pain, nausea and vomiting.  Genitourinary:  Negative for dysuria and hematuria.  Skin:  Negative for color change and rash.  All other systems reviewed and are negative.    Physical Exam Triage Vital Signs ED Triage Vitals [12/16/21 1120]  Enc Vitals Group     BP 115/80     Pulse Rate 81  Resp 18     Temp 97.9 F (36.6 C)     Temp src      SpO2 98 %     Weight      Height      Head Circumference      Peak Flow      Pain Score      Pain Loc      Pain Edu?      Excl. in GC?    No data found.  Updated Vital Signs BP 115/80   Pulse 81   Temp 97.9 F (36.6 C)   Resp 18   LMP 12/02/2021 (Approximate)   SpO2 98%   Visual Acuity Right Eye Distance:   Left Eye Distance:   Bilateral Distance:    Right Eye Near:   Left Eye Near:    Bilateral Near:     Physical Exam Vitals and nursing note reviewed.  Constitutional:      General: She is not in acute distress.    Appearance: Normal appearance. She is well-developed. She is not ill-appearing.  HENT:     Mouth/Throat:     Mouth: Mucous membranes are moist.   Cardiovascular:     Rate and Rhythm: Normal rate and regular rhythm.     Heart sounds: Normal heart sounds.  Pulmonary:     Effort: Pulmonary effort is normal. No respiratory distress.     Breath sounds: Normal breath sounds.  Abdominal:     General: Bowel sounds are normal.     Palpations: Abdomen is soft.     Tenderness: There is no abdominal tenderness. There is no right CVA tenderness, left CVA tenderness, guarding or rebound.  Musculoskeletal:     Cervical back: Neck supple.  Skin:    General: Skin is warm and dry.  Neurological:     Mental Status: She is alert.  Psychiatric:        Mood and Affect: Mood normal.        Behavior: Behavior normal.      UC Treatments / Results  Labs (all labs ordered are listed, but only abnormal results are displayed) Labs Reviewed - No data to display  EKG   Radiology No results found.  Procedures Procedures (including critical care time)  Medications Ordered in UC Medications - No data to display  Initial Impression / Assessment and Plan / UC Course  I have reviewed the triage vital signs and the nursing notes.  Pertinent labs & imaging results that were available during my care of the patient were reviewed by me and considered in my medical decision making (see chart for details).    Diarrhea.  Patient is well-appearing and her exam is reassuring.  VSS. Treating with diarrhea diet.  Discussed over-the-counter Imodium if her symptoms become worse.  Instructed her to keep yourself hydrated with clear liquids.  Education provided on diarrhea.  Instructed her to follow-up with her PCP if her symptoms are not improving.  Work note provided.  She agrees to plan of care.  Final Clinical Impressions(s) / UC Diagnoses   Final diagnoses:  Diarrhea, unspecified type     Discharge Instructions      Follow the diarrhea diet.  Keep yourself hydrated with clear liquids such as water or Gatorade.  Follow up with your primary care  provider if your symptoms are not improving.        ED Prescriptions   None    PDMP not reviewed this encounter.   Arlana Pouch,  Fredrich Romans, NP 12/16/21 1144

## 2021-12-16 NOTE — Discharge Instructions (Addendum)
Follow the diarrhea diet.  Keep yourself hydrated with clear liquids such as water or Gatorade.  Follow up with your primary care provider if your symptoms are not improving.

## 2021-12-16 NOTE — ED Triage Notes (Signed)
Patient presents to Urgent Care with complaints of diarrhea x 2 days. Not taking any meds for symptom relief.   Denies fever, n/v, changes in meds or diet.
